# Patient Record
Sex: Male | Born: 1970 | Race: Black or African American | Hispanic: No | Marital: Married | Smoking: Current every day smoker
Health system: Southern US, Community
[De-identification: ages and names within clinical notes are randomized; demographics above are authoritative.]

## PROBLEM LIST (undated history)

## (undated) DIAGNOSIS — J302 Other seasonal allergic rhinitis: Secondary | ICD-10-CM

## (undated) DIAGNOSIS — I10 Essential (primary) hypertension: Secondary | ICD-10-CM

## (undated) DIAGNOSIS — J45909 Unspecified asthma, uncomplicated: Secondary | ICD-10-CM

## (undated) DIAGNOSIS — N200 Calculus of kidney: Secondary | ICD-10-CM

## (undated) DIAGNOSIS — I447 Left bundle-branch block, unspecified: Secondary | ICD-10-CM

## (undated) HISTORY — DX: Essential (primary) hypertension: I10

---

## 1998-07-23 ENCOUNTER — Emergency Department (HOSPITAL_COMMUNITY): Admission: EM | Admit: 1998-07-23 | Discharge: 1998-07-23 | Payer: Self-pay | Admitting: Emergency Medicine

## 1998-07-24 ENCOUNTER — Encounter: Payer: Self-pay | Admitting: Emergency Medicine

## 1999-01-09 ENCOUNTER — Ambulatory Visit (HOSPITAL_COMMUNITY): Admission: RE | Admit: 1999-01-09 | Discharge: 1999-01-09 | Payer: Self-pay | Admitting: *Deleted

## 1999-05-29 ENCOUNTER — Emergency Department (HOSPITAL_COMMUNITY): Admission: EM | Admit: 1999-05-29 | Discharge: 1999-05-29 | Payer: Self-pay | Admitting: *Deleted

## 1999-06-13 ENCOUNTER — Emergency Department (HOSPITAL_COMMUNITY): Admission: EM | Admit: 1999-06-13 | Discharge: 1999-06-13 | Payer: Self-pay | Admitting: Emergency Medicine

## 2000-05-08 ENCOUNTER — Encounter: Payer: Self-pay | Admitting: Emergency Medicine

## 2000-05-08 ENCOUNTER — Emergency Department (HOSPITAL_COMMUNITY): Admission: EM | Admit: 2000-05-08 | Discharge: 2000-05-08 | Payer: Self-pay | Admitting: Emergency Medicine

## 2001-01-22 ENCOUNTER — Emergency Department (HOSPITAL_COMMUNITY): Admission: EM | Admit: 2001-01-22 | Discharge: 2001-01-23 | Payer: Self-pay | Admitting: Emergency Medicine

## 2001-01-23 ENCOUNTER — Encounter: Payer: Self-pay | Admitting: Emergency Medicine

## 2002-04-04 ENCOUNTER — Encounter: Payer: Self-pay | Admitting: Emergency Medicine

## 2002-04-04 ENCOUNTER — Emergency Department (HOSPITAL_COMMUNITY): Admission: EM | Admit: 2002-04-04 | Discharge: 2002-04-04 | Payer: Self-pay | Admitting: Emergency Medicine

## 2002-11-28 ENCOUNTER — Emergency Department (HOSPITAL_COMMUNITY): Admission: EM | Admit: 2002-11-28 | Discharge: 2002-11-28 | Payer: Self-pay | Admitting: Neurology

## 2004-04-23 ENCOUNTER — Emergency Department (HOSPITAL_COMMUNITY): Admission: EM | Admit: 2004-04-23 | Discharge: 2004-04-23 | Payer: Self-pay | Admitting: Emergency Medicine

## 2005-01-09 ENCOUNTER — Emergency Department (HOSPITAL_COMMUNITY): Admission: EM | Admit: 2005-01-09 | Discharge: 2005-01-09 | Payer: Self-pay | Admitting: Emergency Medicine

## 2007-09-15 ENCOUNTER — Emergency Department (HOSPITAL_COMMUNITY): Admission: EM | Admit: 2007-09-15 | Discharge: 2007-09-15 | Payer: Self-pay | Admitting: Emergency Medicine

## 2007-09-30 ENCOUNTER — Emergency Department (HOSPITAL_COMMUNITY): Admission: EM | Admit: 2007-09-30 | Discharge: 2007-09-30 | Payer: Self-pay | Admitting: Emergency Medicine

## 2008-03-24 ENCOUNTER — Emergency Department (HOSPITAL_BASED_OUTPATIENT_CLINIC_OR_DEPARTMENT_OTHER): Admission: EM | Admit: 2008-03-24 | Discharge: 2008-03-24 | Payer: Self-pay | Admitting: Emergency Medicine

## 2008-06-28 ENCOUNTER — Emergency Department (HOSPITAL_BASED_OUTPATIENT_CLINIC_OR_DEPARTMENT_OTHER): Admission: EM | Admit: 2008-06-28 | Discharge: 2008-06-28 | Payer: Self-pay | Admitting: Emergency Medicine

## 2008-07-08 ENCOUNTER — Emergency Department (HOSPITAL_BASED_OUTPATIENT_CLINIC_OR_DEPARTMENT_OTHER): Admission: EM | Admit: 2008-07-08 | Discharge: 2008-07-08 | Payer: Self-pay | Admitting: Emergency Medicine

## 2008-11-13 ENCOUNTER — Ambulatory Visit: Payer: Self-pay | Admitting: Diagnostic Radiology

## 2008-11-13 ENCOUNTER — Emergency Department (HOSPITAL_BASED_OUTPATIENT_CLINIC_OR_DEPARTMENT_OTHER): Admission: EM | Admit: 2008-11-13 | Discharge: 2008-11-13 | Payer: Self-pay | Admitting: Emergency Medicine

## 2008-12-03 ENCOUNTER — Emergency Department (HOSPITAL_BASED_OUTPATIENT_CLINIC_OR_DEPARTMENT_OTHER): Admission: EM | Admit: 2008-12-03 | Discharge: 2008-12-03 | Payer: Self-pay | Admitting: Emergency Medicine

## 2008-12-10 ENCOUNTER — Emergency Department (HOSPITAL_BASED_OUTPATIENT_CLINIC_OR_DEPARTMENT_OTHER): Admission: EM | Admit: 2008-12-10 | Discharge: 2008-12-10 | Payer: Self-pay | Admitting: Emergency Medicine

## 2009-09-16 ENCOUNTER — Ambulatory Visit: Payer: Self-pay | Admitting: Interventional Radiology

## 2009-09-16 ENCOUNTER — Emergency Department (HOSPITAL_BASED_OUTPATIENT_CLINIC_OR_DEPARTMENT_OTHER): Admission: EM | Admit: 2009-09-16 | Discharge: 2009-09-16 | Payer: Self-pay | Admitting: Emergency Medicine

## 2009-11-05 ENCOUNTER — Emergency Department (HOSPITAL_BASED_OUTPATIENT_CLINIC_OR_DEPARTMENT_OTHER): Admission: EM | Admit: 2009-11-05 | Discharge: 2009-11-05 | Payer: Self-pay | Admitting: Emergency Medicine

## 2009-12-10 ENCOUNTER — Emergency Department (HOSPITAL_BASED_OUTPATIENT_CLINIC_OR_DEPARTMENT_OTHER): Admission: EM | Admit: 2009-12-10 | Discharge: 2009-12-10 | Payer: Self-pay | Admitting: Emergency Medicine

## 2009-12-10 ENCOUNTER — Ambulatory Visit: Payer: Self-pay | Admitting: Radiology

## 2010-02-07 ENCOUNTER — Ambulatory Visit: Payer: Self-pay | Admitting: Diagnostic Radiology

## 2010-02-07 ENCOUNTER — Emergency Department (HOSPITAL_BASED_OUTPATIENT_CLINIC_OR_DEPARTMENT_OTHER): Admission: EM | Admit: 2010-02-07 | Discharge: 2010-02-07 | Payer: Self-pay | Admitting: Emergency Medicine

## 2010-02-15 ENCOUNTER — Emergency Department (HOSPITAL_BASED_OUTPATIENT_CLINIC_OR_DEPARTMENT_OTHER): Admission: EM | Admit: 2010-02-15 | Discharge: 2010-02-15 | Payer: Self-pay | Admitting: Emergency Medicine

## 2010-02-15 ENCOUNTER — Ambulatory Visit: Payer: Self-pay | Admitting: Radiology

## 2010-03-01 ENCOUNTER — Encounter: Admission: RE | Admit: 2010-03-01 | Discharge: 2010-05-17 | Payer: Self-pay | Admitting: Plastic Surgery

## 2010-03-11 ENCOUNTER — Emergency Department (HOSPITAL_BASED_OUTPATIENT_CLINIC_OR_DEPARTMENT_OTHER): Admission: EM | Admit: 2010-03-11 | Discharge: 2010-03-11 | Payer: Self-pay | Admitting: Emergency Medicine

## 2010-03-22 IMAGING — CT CT HEAD W/O CM
1 series · 15 of 30 positions shown, 19 images · non-contrast
Comparison: None

CT HEAD WITHOUT CONTRAST

CLINICAL DATA: Headache, frontal and sinus pain.

CT HEAD WITHOUT CONTRAST,CT PARANASAL SINUS LIMITED WITHOUT
CONTRAST
TECHNIQUE: Contiguous axial images were obtained from the base of
the skull through the vertex without contrast. Multidetector CT
images of the paranasal sinuses were obtained in a single plane
without contrast.

[Series 2: limited sinus 3.0 h60s · axial · 0.35mm/px · z∈[+1196,+1314]mm · 15 of 43 slices shown, 19 images]
[im 2/43  brain]
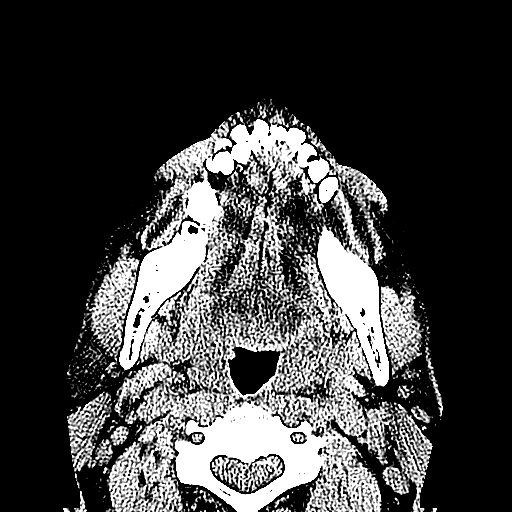
[im 2/43  bone]
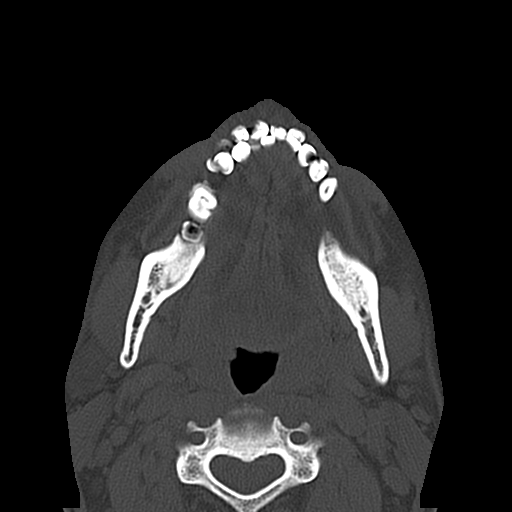
[im 5/43  brain]
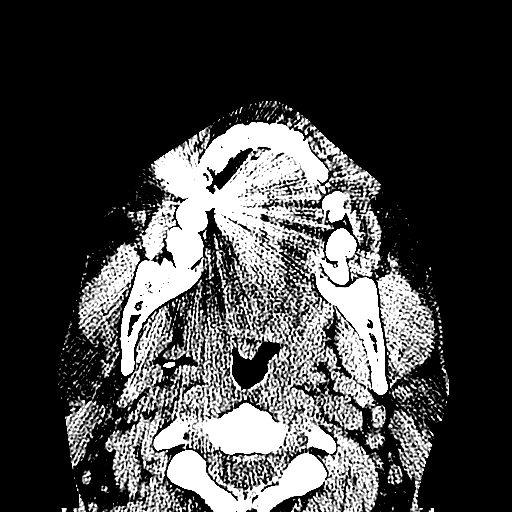
[im 8/43  brain]
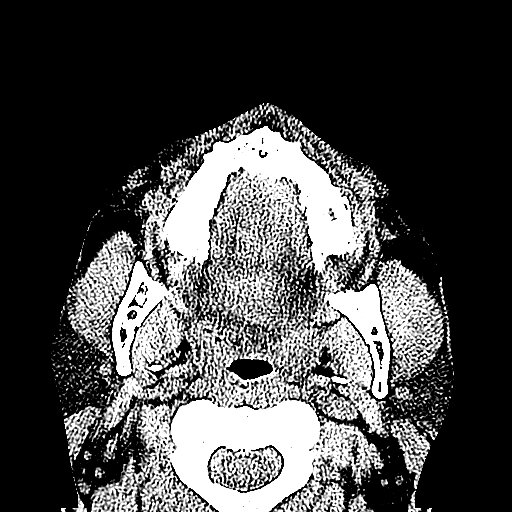
[im 11/43  brain]
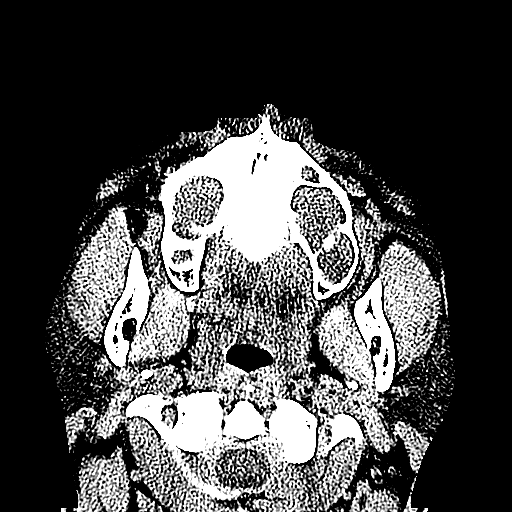
[im 14/43  brain]
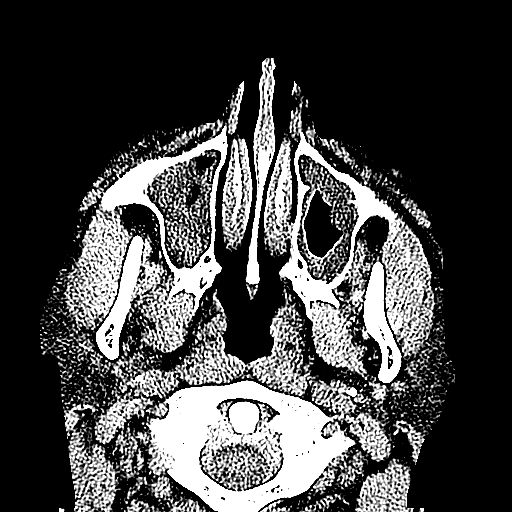
[im 14/43  bone]
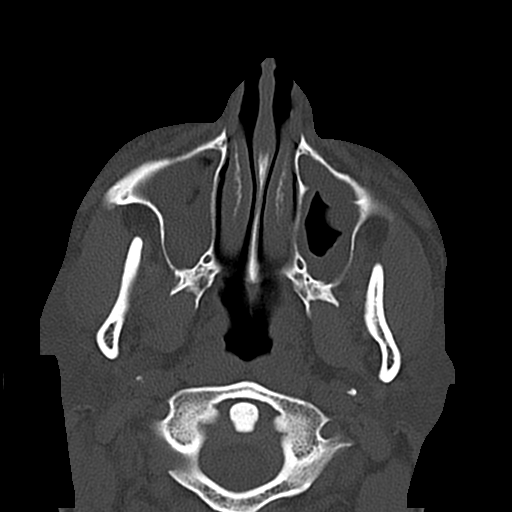
[im 16/43  brain]
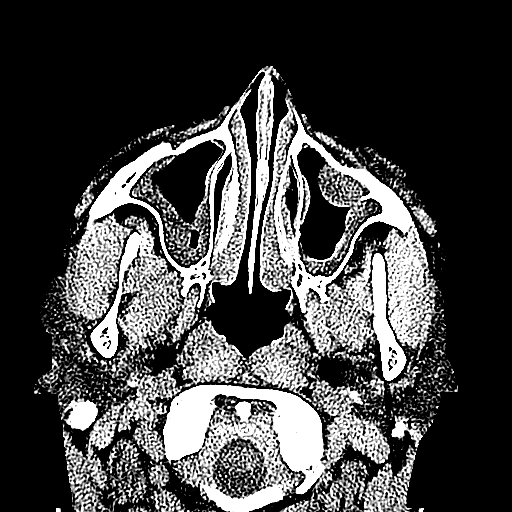
[im 19/43  brain]
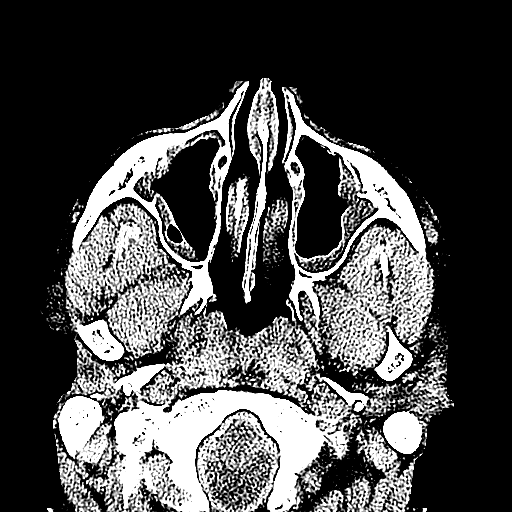
[im 22/43  brain]
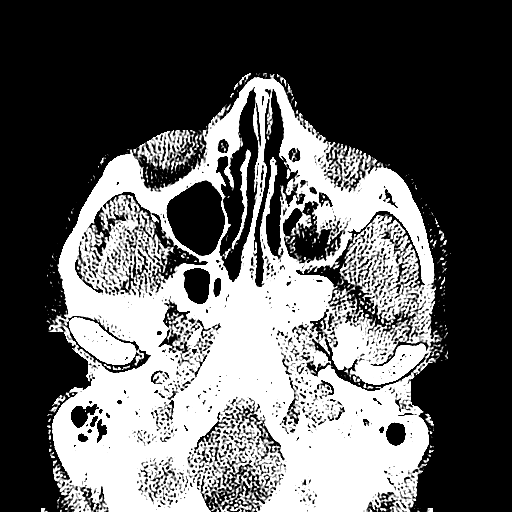
[im 24/43  brain]
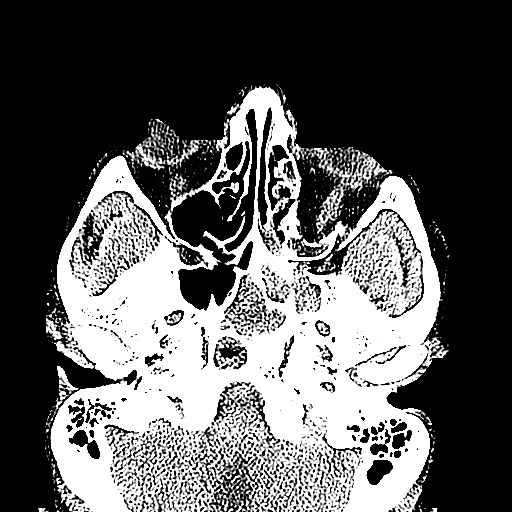
[im 24/43  bone]
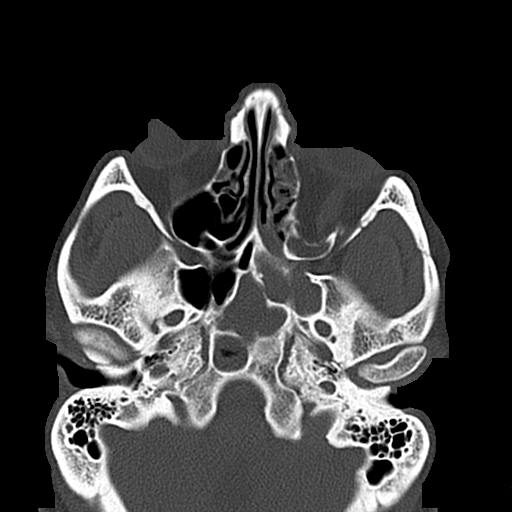
[im 27/43  brain]
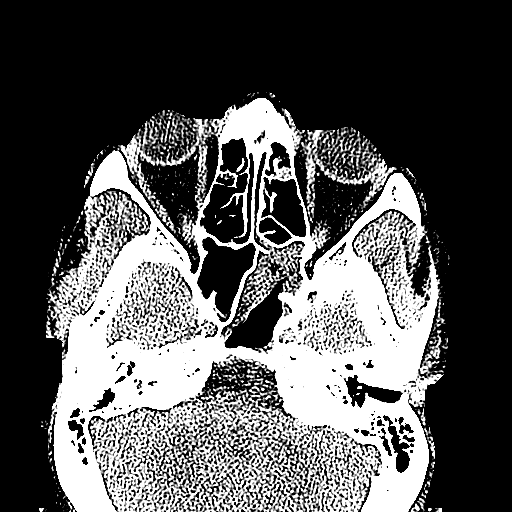
[im 29/43  brain]
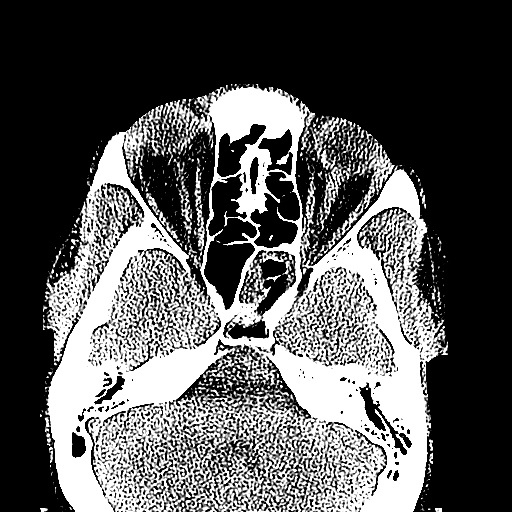
[im 32/43  brain]
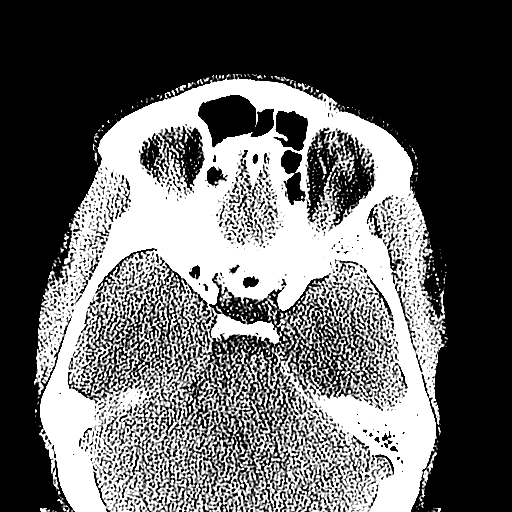
[im 35/43  brain]
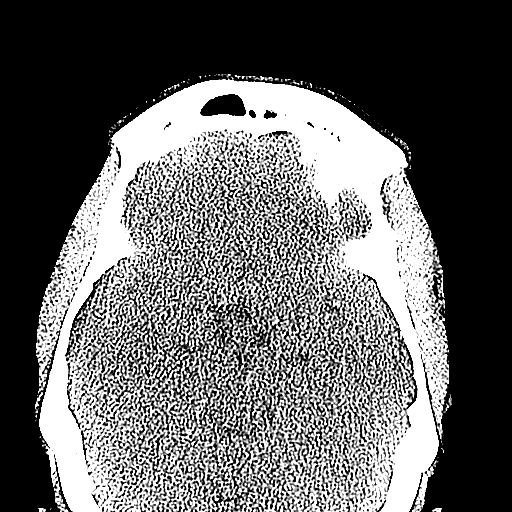
[im 35/43  bone]
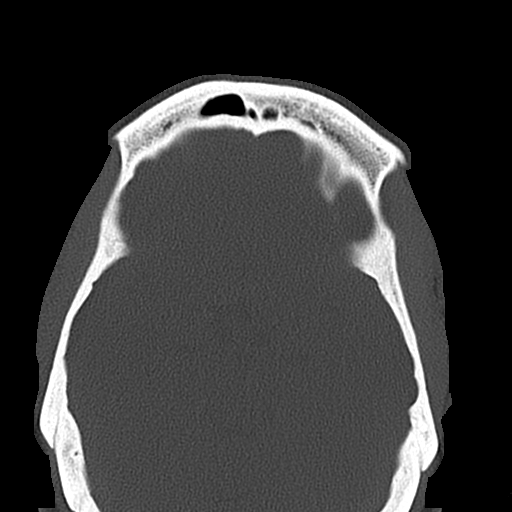
[im 38/43  brain]
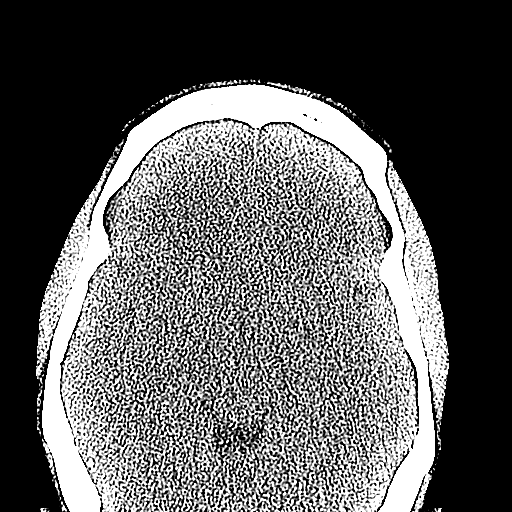
[im 41/43  brain]
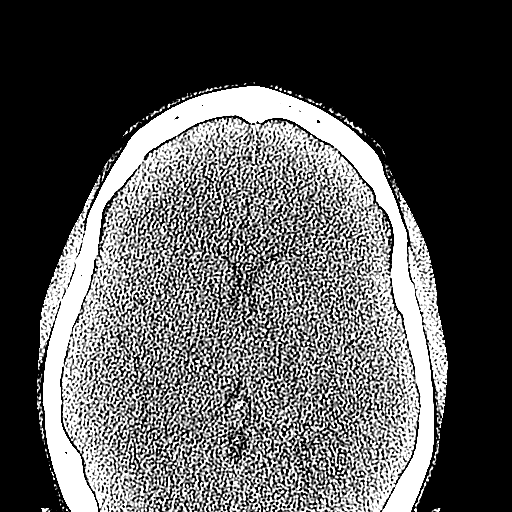

[15 of 30 positions shown; findings below may reference images not displayed]

FINDINGS: No acute intracranial abnormalities are identified,
including mass lesion or mass effect, hydrocephalus, extra-axial
fluid collection, midline shift, hemorrhage, or acute infarction.
Please note that acute infarction may be occult on CT for 24-48
hours.

Mucosal thickening within the left maxillary and left sphenoid
sinuses are noted.
Mild irregularity of the floor of the left orbit is suggestive of
prior fracture.
IMPRESSION: Left maxillary and left sphenoid sinusitis - appears chronic.

No evidence of acute intracranial abnormality.

CT PARANASAL SINUS LIMITED WITHOUT CONTRAST
FINDINGS: Moderate mucosal thickening is identified within both
maxillary sinuses, the left sphenoid sinus, and scattered left
ethmoid air cells.
No definite air-fluid levels are noted.
There is no evidence of bony destruction or mass.
The globes are within normal limits.
Irregularity of the left orbital floor suggest prior fracture.
IMPRESSION: Bilateral maxillary, left sphenoid, and left ethmoid sinusitis -
appears chronic.

Probable remote fracture of the left orbital floor.

## 2010-08-21 ENCOUNTER — Emergency Department (HOSPITAL_BASED_OUTPATIENT_CLINIC_OR_DEPARTMENT_OTHER)
Admission: EM | Admit: 2010-08-21 | Discharge: 2010-08-21 | Payer: Self-pay | Source: Home / Self Care | Admitting: Emergency Medicine

## 2010-08-21 LAB — URINALYSIS, ROUTINE W REFLEX MICROSCOPIC
Bilirubin Urine: NEGATIVE
Hemoglobin, Urine: NEGATIVE
Ketones, ur: NEGATIVE mg/dL
Nitrite: NEGATIVE
Protein, ur: NEGATIVE mg/dL
Specific Gravity, Urine: 1.008 (ref 1.005–1.030)
Urine Glucose, Fasting: NEGATIVE mg/dL
Urobilinogen, UA: 0.2 mg/dL (ref 0.0–1.0)
pH: 6.5 (ref 5.0–8.0)

## 2010-11-03 LAB — DIFFERENTIAL
Basophils Absolute: 0 10*3/uL (ref 0.0–0.1)
Basophils Relative: 1 % (ref 0–1)
Eosinophils Absolute: 0.1 10*3/uL (ref 0.0–0.7)
Eosinophils Relative: 1 % (ref 0–5)
Lymphocytes Relative: 34 % (ref 12–46)
Lymphs Abs: 2.2 10*3/uL (ref 0.7–4.0)
Monocytes Absolute: 0.5 10*3/uL (ref 0.1–1.0)
Monocytes Relative: 7 % (ref 3–12)
Neutro Abs: 3.8 10*3/uL (ref 1.7–7.7)
Neutrophils Relative %: 58 % (ref 43–77)

## 2010-11-03 LAB — BASIC METABOLIC PANEL
BUN: 13 mg/dL (ref 6–23)
CO2: 25 mEq/L (ref 19–32)
Calcium: 9 mg/dL (ref 8.4–10.5)
Chloride: 107 mEq/L (ref 96–112)
Creatinine, Ser: 0.9 mg/dL (ref 0.4–1.5)
GFR calc Af Amer: >60 mL/min (ref >60)
GFR calc non Af Amer: >60 mL/min (ref >60)
Glucose, Bld: 87 mg/dL (ref 70–99)
Potassium: 4.1 mEq/L (ref 3.5–5.1)
Sodium: 141 mEq/L (ref 135–145)

## 2010-11-03 LAB — CBC
HCT: 39.7 % (ref 39.0–52.0)
Hemoglobin: 13.6 g/dL (ref 13.0–17.0)
MCH: 31.4 pg (ref 26.0–34.0)
MCHC: 34.3 g/dL (ref 30.0–36.0)
MCV: 91.5 fL (ref 78.0–100.0)
Platelets: 201 10*3/uL (ref 150–400)
RBC: 4.34 MIL/uL (ref 4.22–5.81)
RDW: 12.3 % (ref 11.5–15.5)
WBC: 6.6 10*3/uL (ref 4.0–10.5)

## 2010-11-05 LAB — URINALYSIS, ROUTINE W REFLEX MICROSCOPIC
Bilirubin Urine: NEGATIVE
Glucose, UA: 100 mg/dL — AB
Hgb urine dipstick: NEGATIVE
Ketones, ur: NEGATIVE mg/dL
Nitrite: NEGATIVE
Protein, ur: NEGATIVE mg/dL
Specific Gravity, Urine: 1.019 (ref 1.005–1.030)
Urobilinogen, UA: 1 mg/dL (ref 0.0–1.0)
pH: 6 (ref 5.0–8.0)

## 2010-11-05 LAB — DIFFERENTIAL
Basophils Absolute: 0.1 10*3/uL (ref 0.0–0.1)
Basophils Relative: 1 % (ref 0–1)
Eosinophils Absolute: 0 10*3/uL (ref 0.0–0.7)
Eosinophils Relative: 1 % (ref 0–5)
Lymphocytes Relative: 25 % (ref 12–46)
Lymphs Abs: 1.9 10*3/uL (ref 0.7–4.0)
Monocytes Absolute: 0.4 10*3/uL (ref 0.1–1.0)
Monocytes Relative: 6 % (ref 3–12)
Neutro Abs: 4.9 10*3/uL (ref 1.7–7.7)
Neutrophils Relative %: 67 % (ref 43–77)

## 2010-11-05 LAB — POCT TOXICOLOGY PANEL

## 2010-11-05 LAB — BASIC METABOLIC PANEL
BUN: 10 mg/dL (ref 6–23)
CO2: 23 mEq/L (ref 19–32)
Calcium: 9.4 mg/dL (ref 8.4–10.5)
Chloride: 107 mEq/L (ref 96–112)
Creatinine, Ser: 0.9 mg/dL (ref 0.4–1.5)
GFR calc Af Amer: >60 mL/min (ref >60)
GFR calc non Af Amer: >60 mL/min (ref >60)
Glucose, Bld: 93 mg/dL (ref 70–99)
Potassium: 3.9 mEq/L (ref 3.5–5.1)
Sodium: 144 mEq/L (ref 135–145)

## 2010-11-05 LAB — CBC
HCT: 43.3 % (ref 39.0–52.0)
Hemoglobin: 14.8 g/dL (ref 13.0–17.0)
MCHC: 34.3 g/dL (ref 30.0–36.0)
MCV: 90.9 fL (ref 78.0–100.0)
Platelets: 168 10*3/uL (ref 150–400)
RBC: 4.77 MIL/uL (ref 4.22–5.81)
RDW: 12.2 % (ref 11.5–15.5)
WBC: 7.3 10*3/uL (ref 4.0–10.5)

## 2010-11-05 LAB — LIPASE, BLOOD: Lipase: 27 U/L (ref 23–300)

## 2011-05-08 LAB — DIFFERENTIAL
Basophils Absolute: 0
Basophils Relative: 0
Eosinophils Relative: 0
Lymphocytes Relative: 26
Monocytes Absolute: 0.7
Monocytes Relative: 6

## 2011-05-08 LAB — BASIC METABOLIC PANEL
CO2: 25
Chloride: 101
Glucose, Bld: 132 — ABNORMAL HIGH
Potassium: 3.5
Sodium: 136

## 2011-05-08 LAB — CBC
HCT: 41.2
Hemoglobin: 14.6
MCHC: 35.4
RDW: 12.8

## 2011-05-08 LAB — RAPID URINE DRUG SCREEN, HOSP PERFORMED
Benzodiazepines: NOT DETECTED
Tetrahydrocannabinol: NOT DETECTED

## 2011-06-24 IMAGING — CR DG FINGER LITTLE 2+V*L*
3 series · 3 of 3 positions shown · non-contrast
Comparison: None.

CLINICAL DATA: Jammed finger playing football.  Pain and swelling.

LEFT LITTLE FINGER 2+V

[x finger pa left]
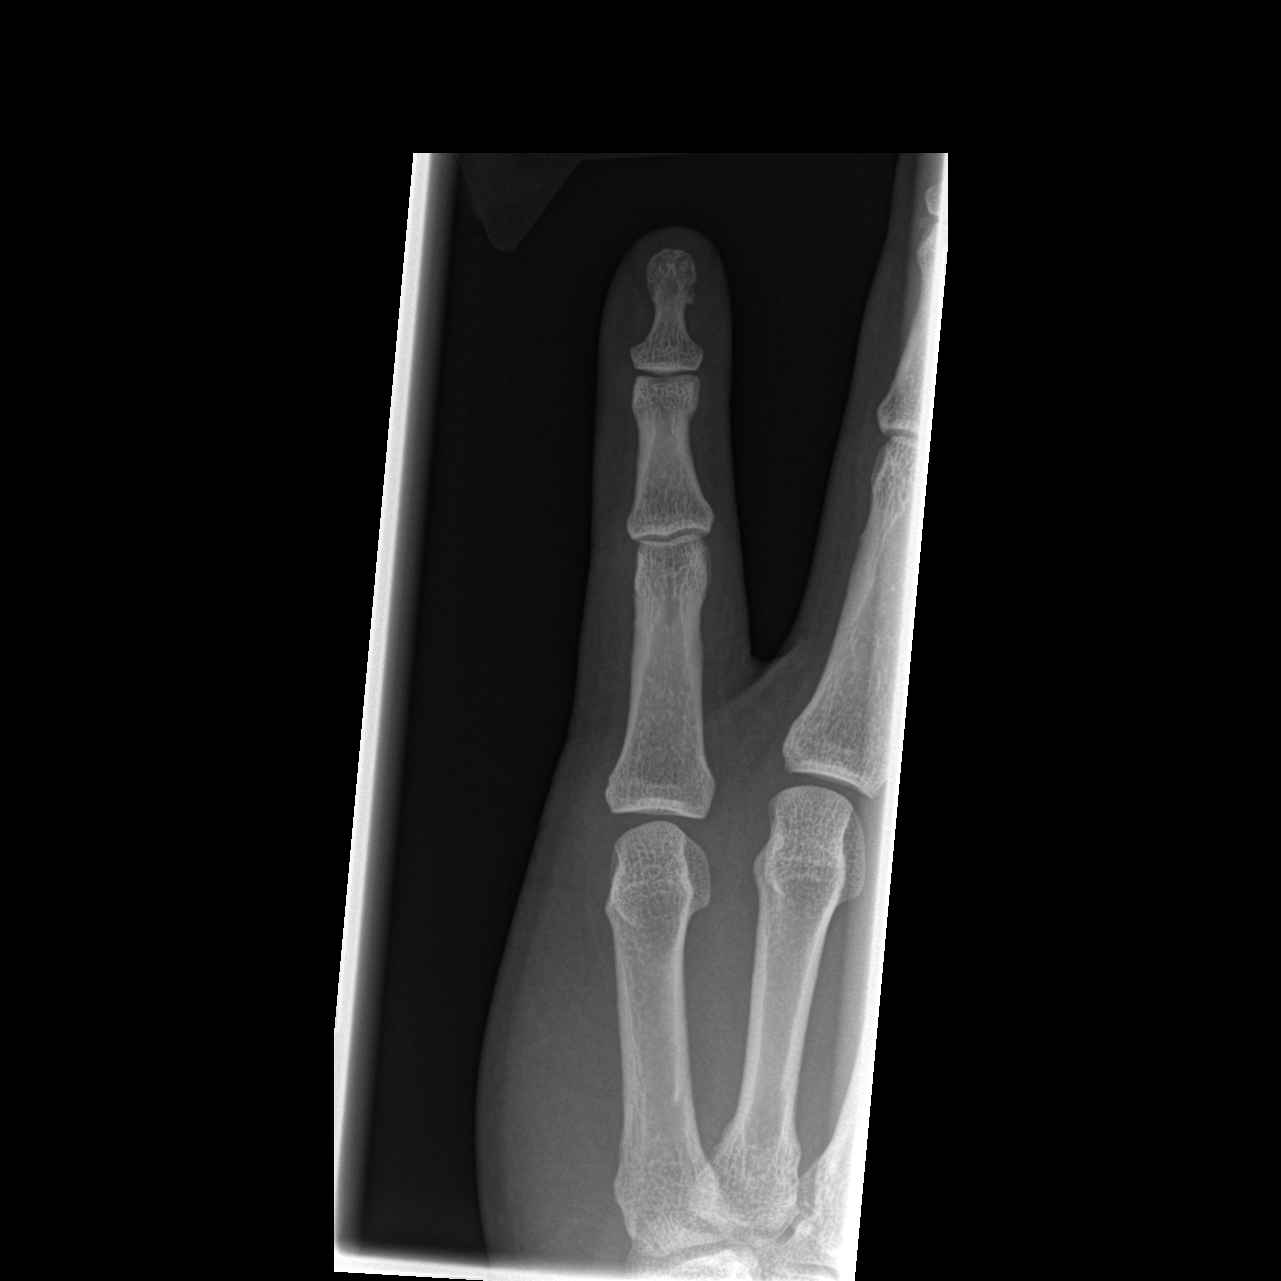

[x finger obl. left]
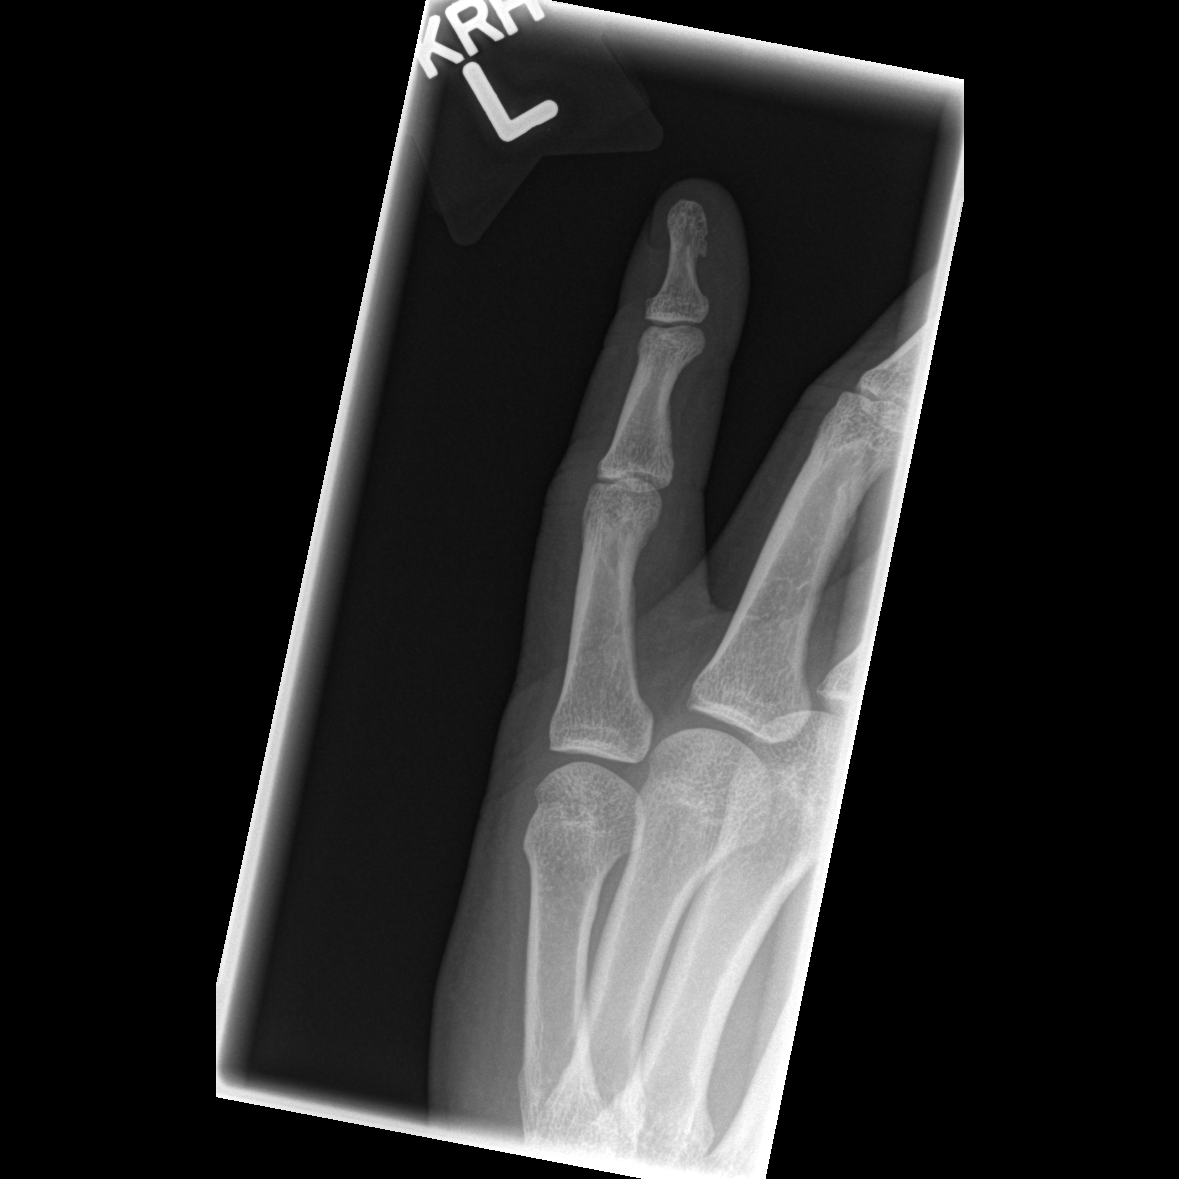

[x finger lateral left]
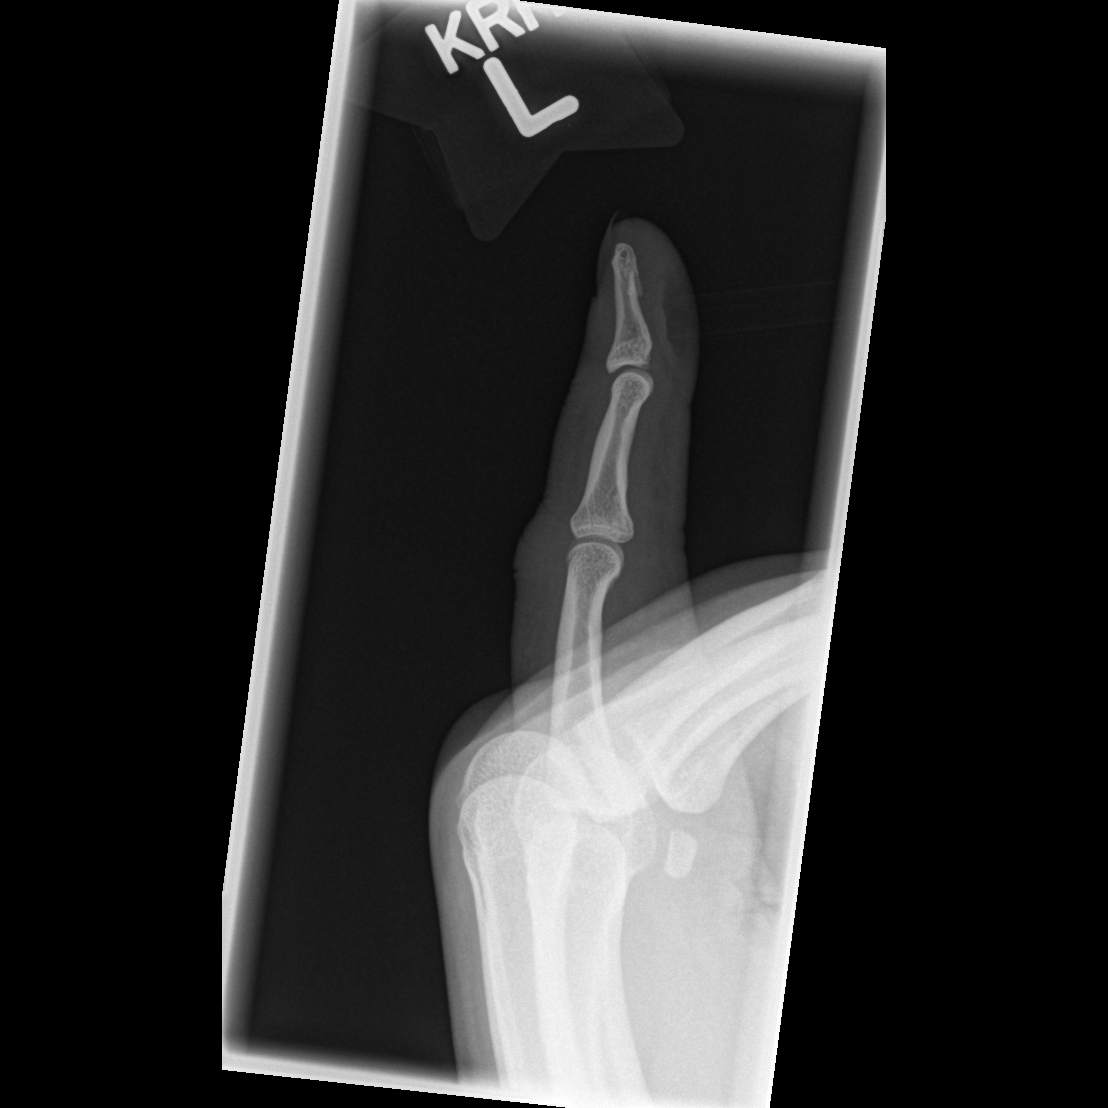

[3 of 3 positions shown; findings below may reference images not displayed]

FINDINGS: No fracture or dislocation.  No foreign body or other
abnormality of the soft tissues.
IMPRESSION: No acute or significant findings.

## 2011-10-18 ENCOUNTER — Encounter (HOSPITAL_BASED_OUTPATIENT_CLINIC_OR_DEPARTMENT_OTHER): Payer: Self-pay | Admitting: *Deleted

## 2011-10-18 ENCOUNTER — Emergency Department (INDEPENDENT_AMBULATORY_CARE_PROVIDER_SITE_OTHER): Payer: BC Managed Care – PPO

## 2011-10-18 ENCOUNTER — Emergency Department (HOSPITAL_BASED_OUTPATIENT_CLINIC_OR_DEPARTMENT_OTHER)
Admission: EM | Admit: 2011-10-18 | Discharge: 2011-10-18 | Disposition: A | Discharge status: Home / Self Care | Payer: Self-pay | Attending: Emergency Medicine | Admitting: Emergency Medicine

## 2011-10-18 DIAGNOSIS — X58XXXA Exposure to other specified factors, initial encounter: Secondary | ICD-10-CM | POA: Insufficient documentation

## 2011-10-18 DIAGNOSIS — M25539 Pain in unspecified wrist: Secondary | ICD-10-CM | POA: Insufficient documentation

## 2011-10-18 DIAGNOSIS — R209 Unspecified disturbances of skin sensation: Secondary | ICD-10-CM | POA: Insufficient documentation

## 2011-10-18 DIAGNOSIS — S5400XA Injury of ulnar nerve at forearm level, unspecified arm, initial encounter: Secondary | ICD-10-CM | POA: Insufficient documentation

## 2011-10-18 MED ORDER — HYDROCODONE-ACETAMINOPHEN 5-325 MG PO TABS: 2.0000 | ORAL_TABLET | Freq: Four times a day (QID) | ORAL | Status: AC | PRN

## 2011-10-18 MED ORDER — INDOMETHACIN 25 MG PO CAPS
50.0000 mg | ORAL_CAPSULE | Freq: Once | ORAL | Status: AC
  Administered 2011-10-18: 50 mg via ORAL
  Filled 2011-10-18: qty 2

## 2011-10-18 MED ORDER — HYDROCODONE-ACETAMINOPHEN 5-325 MG PO TABS
2.0000 | ORAL_TABLET | Freq: Once | ORAL | Status: AC
  Administered 2011-10-18: 2 via ORAL
  Filled 2011-10-18: qty 2

## 2011-10-18 MED ORDER — INDOMETHACIN 50 MG PO CAPS: 50.0000 mg | ORAL_CAPSULE | Freq: Three times a day (TID) | ORAL | Status: DC

## 2011-10-18 NOTE — ED Notes (Signed)
MD at bedside. 

## 2011-10-18 NOTE — ED Provider Notes (Signed)
History     CSN: [id number]  Arrival date & time 10/18/11  4098   First MD Initiated Contact with Patient 10/18/11 [phone]      Chief Complaint  Patient presents with  . Wrist Pain    left    (Consider location/radiation/quality/duration/timing/severity/associated sxs/prior treatment) HPI Patient is a 41 yo M who presents complaining of left wrist pain.  The patient is right hand dominant and works as an Personnel officer.  The only possible trauma he can remember was when his drill cut the volar and ulnar aspect of his left wrist on Thursday.  This drew blood but was not particularly painful.  The patient denies any associated skin changes and his pain is 9/10.  It is worse with wrist adduction and supination of the forearm.  He endorses decreased sensation over the palm in the ulnar nerve distribution. Patient noted his pain when he woke up on Friday.    History reviewed. No pertinent past medical history.  History reviewed. No pertinent past surgical history.  History reviewed. No pertinent family history.  History  Substance Use Topics  . Smoking status: Current Everyday Smoker -- 0.5 packs/day  . Smokeless tobacco: Not on file  . Alcohol Use: Yes      Review of Systems  Constitutional: Negative.   HENT: Negative.   Eyes: Negative.   Respiratory: Negative.   Cardiovascular: Negative.   Gastrointestinal: Negative.   Genitourinary: Negative.   Musculoskeletal:       Left wrist pain  Skin: Negative.   Neurological: Negative.   Hematological: Negative.   Psychiatric/Behavioral: Negative.   All other systems reviewed and are negative.    Allergies  Review of patient's allergies indicates no known allergies.  Home Medications   Current Outpatient Rx  Name Route Sig Dispense Refill  . HYDROCODONE-ACETAMINOPHEN 5-325 MG PO TABS Oral Take 2 tablets by mouth every 6 (six) hours as needed for pain. 30 tablet 0  . INDOMETHACIN 50 MG PO CAPS Oral Take 1 capsule (50 mg total)  by mouth 3 (three) times daily with meals. 30 capsule 0    BP 150/98  Pulse 82  Temp(Src) 97.3 F (36.3 C) (Oral)  Resp 16  Ht 6\' 3"  (1.905 m)  Wt 260 lb (117.935 kg)  BMI 32.50 kg/m2  SpO2 99%  Physical Exam  Nursing note and vitals reviewed. GEN: Well-developed, well-nourished male in no distress HEENT: Atraumatic, normocephalic.  EYES: PERRLA BL, no scleral icterus. NECK: Trachea midline, no meningismus Neuro: cranial nerves 2-12 intact, no abnormalities of strength or sensation, A and O x 3 MSK: Patient moves all 4 extremities symmetrically, pain on left wrist adduction and left forearm supination, decreased sensation over the volar left 4th and fifth digits as well as the hypothenar eminence in the ulnar nerve distribution, forearm compartments are soft  Skin: small scratch noted over the volar aspect of the left wrist with no surrounding erythema or edema Psych: no abnormality of mood   ED Course  Procedures (including critical care time)  Labs Reviewed - No data to display Dg Wrist Complete Left  10/18/2011  *RADIOLOGY REPORT*  Clinical Data: Pain without trauma  LEFT WRIST - COMPLETE 3+ VIEW  Comparison: None.  Findings: Carpal rows intact. Negative for fracture, dislocation, or other acute abnormality.  Normal alignment and mineralization. No significant degenerative change.  Regional soft tissues unremarkable.  IMPRESSION:  Negative  Original Report Authenticated By: Osa Craver, M.D.     1. Ulnar nerve injury  MDM  Patient was evaluated and had no signs of bony injury or compartment syndrome to explain his symptoms.  Plain film was performed for evidence of possible gas from infection or a possible small metallic foreign body from drill injury.  This was unremarkable.  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 Patient was placed in an ulnar gutter splint and given indomethacin and vicodin.  He is.told to avoid the painful  movements and take anti-inflammatories.  He is referred to sports medicine for further management if symptoms persist despite conservative efforts.  Patient was NVI after splinting and was discharged in good condition.        Cyndra Numbers, MD 10/18/11 [phone]

## 2011-10-18 NOTE — ED Notes (Signed)
Right wrist and hand swelling

## 2011-10-22 ENCOUNTER — Ambulatory Visit (INDEPENDENT_AMBULATORY_CARE_PROVIDER_SITE_OTHER): Payer: Self-pay | Admitting: Family Medicine

## 2011-10-22 ENCOUNTER — Encounter: Payer: Self-pay | Admitting: Family Medicine

## 2011-10-22 VITALS — BP 157/94 | HR 83 | Temp 98.6°F | Ht 75.0 in | Wt 240.0 lb

## 2011-10-22 DIAGNOSIS — G5620 Lesion of ulnar nerve, unspecified upper limb: Secondary | ICD-10-CM

## 2011-10-22 DIAGNOSIS — M25539 Pain in unspecified wrist: Secondary | ICD-10-CM

## 2011-10-22 DIAGNOSIS — M25532 Pain in left wrist: Secondary | ICD-10-CM

## 2011-10-22 MED ORDER — PREDNISONE (PAK) 10 MG PO TABS: ORAL_TABLET | ORAL | Status: DC

## 2011-10-22 MED ORDER — MELOXICAM 15 MG PO TABS: 15.0000 mg | ORAL_TABLET | Freq: Every day | ORAL | Status: AC

## 2011-10-22 NOTE — Patient Instructions (Signed)
Your history and symptoms are consistent with pinching of the ulnar nerve as it comes through Guyon's canal at the wrist. Start prednisone as directed then after finishing take meloxicam 15mg  daily with food. Vicodin as needed for severe pain (we don't refill this but can step you down to a milder narcotic if you run out) - no driving on this. Do not ice this - they can make nerve issues worse. Wrist brace as much as possible including at night to protect the area and prevent compression. Follow up with me in 2 weeks. If not improved we would move forward with nerve conduction studies.

## 2011-10-23 ENCOUNTER — Encounter: Payer: Self-pay | Admitting: Family Medicine

## 2011-10-23 DIAGNOSIS — M25532 Pain in left wrist: Secondary | ICD-10-CM | POA: Insufficient documentation

## 2011-10-23 NOTE — Progress Notes (Signed)
Subjective:    Patient ID: Kyle Wall, male    DOB: 09/24/1970, 41 y.o.   MRN: [id number]  PCP: None  HPI 41 yo F here for left hand pain.  Patient denies known injury or trauma to left hand. He had a drill bit burn him dorsal left wrist but states it did not puncture the skin. States he woke up about 1 1/2 weeks ago with worsening pain, numbness/tingling in ulnar side of wrist, hand, and into 4th and 5th fingers. Went to ED - told he likely had an ulnar nerve injury/neuropathy and referred here. Was placed in an ulnar gutter splint. States symptoms have improved over past few days. A little swelling in this area. He is right handed. Pain, numbness worse with activity. No prior wrist, elbow injuries.  No elbow pain.  Past Medical History  Diagnosis Date  . Hypertension     Current Outpatient Prescriptions on File Prior to Visit  Medication Sig Dispense Refill  . HYDROcodone-acetaminophen (NORCO) 5-325 MG per tablet Take 2 tablets by mouth every 6 (six) hours as needed for pain.  30 tablet  0    History reviewed. No pertinent past surgical history.  No Known Allergies  History   Social History  . Marital Status: Married    Spouse Name: N/A    Number of Children: N/A  . Years of Education: N/A   Occupational History  . Not on file.   Social History Main Topics  . Smoking status: Current Everyday Smoker -- 0.5 packs/day  . Smokeless tobacco: Not on file  . Alcohol Use: Yes  . Drug Use: No  . Sexually Active: Not on file   Other Topics Concern  . Not on file   Social History Narrative  . No narrative on file    Family History  Problem Relation Age of Onset  . Hypertension Mother   . Hypertension Father   . Diabetes Neg Hx   . Heart attack Neg Hx   . Hyperlipidemia Neg Hx   . Sudden death Neg Hx     BP 157/94  Pulse 83  Temp(Src) 98.6 F (37 C) (Oral)  Ht 6\' 3"  (1.905 m)  Wt 240 lb (108.863 kg)  BMI 30.00 kg/m2  Review of Systems See HPI  above.    Objective:   Physical Exam Gen: NAD  L wrist: Mild swelling ulnar side but symmatric bilaterally.  No bruising.  Very superficial ?burn this side of wrist also but not directly over guyons canal.  No other deformity. TTP guyon's canal, less so ulnar side of hand and 4th/5th digits. Mod limitation of flexion and extension due to pain.  FROM digits. Strength 5/5 with finger abduction, extension.  5-/5 with thumb opposition. Sensation diminished to light touch ulnar hand and 4th/5th digits. Cap refill < 2 secs. Mild + tinels guyon canal - negative at cubital tunnel and carpal tunnel.  R wrist: FROM without pain, swelling.    Assessment & Plan:  1.  Left wrist pain - History and exam consistent with guyon canal syndrome.  No trauma and well-preserved strength (aside from very mild opposition weakness) - do not believe burn from drill bit caused nerve injury/transection.  Should resolve with time and rest.  Wrist brace provided.  Start prednisone with transition to mobic.  Hydrocodone as needed for severe pain - refill provided today #60 tablets to take every 6 hours as needed.  See instructions for further.  F/u in 2 weeks.  If  not improving would consider nerve conduction velocities/EMG though these can be falsely negative within 6 weeks of presentation.

## 2011-10-23 NOTE — Assessment & Plan Note (Signed)
History and exam consistent with guyon canal syndrome.  No trauma and well-preserved strength (aside from very mild opposition weakness) - do not believe burn from drill bit caused nerve injury/transection.  Should resolve with time and rest.  Wrist brace provided.  Start prednisone with transition to mobic.  Hydrocodone as needed for severe pain - refill provided today #60 tablets to take every 6 hours as needed.  See instructions for further.  F/u in 2 weeks.  If not improving would consider nerve conduction velocities/EMG though these can be falsely negative within 6 weeks of presentation.

## 2011-11-04 ENCOUNTER — Emergency Department (HOSPITAL_BASED_OUTPATIENT_CLINIC_OR_DEPARTMENT_OTHER)
Admission: EM | Admit: 2011-11-04 | Discharge: 2011-11-05 | Disposition: A | Discharge status: Home / Self Care | Payer: Self-pay | Attending: Emergency Medicine | Admitting: Emergency Medicine

## 2011-11-04 ENCOUNTER — Encounter (HOSPITAL_BASED_OUTPATIENT_CLINIC_OR_DEPARTMENT_OTHER): Payer: Self-pay | Admitting: *Deleted

## 2011-11-04 DIAGNOSIS — R109 Unspecified abdominal pain: Secondary | ICD-10-CM | POA: Insufficient documentation

## 2011-11-04 DIAGNOSIS — I10 Essential (primary) hypertension: Secondary | ICD-10-CM | POA: Insufficient documentation

## 2011-11-04 DIAGNOSIS — R1030 Lower abdominal pain, unspecified: Secondary | ICD-10-CM

## 2011-11-04 HISTORY — DX: Calculus of kidney: N20.0

## 2011-11-04 LAB — URINALYSIS, ROUTINE W REFLEX MICROSCOPIC
Glucose, UA: NEGATIVE mg/dL
Leukocytes, UA: NEGATIVE
Nitrite: NEGATIVE
Protein, ur: NEGATIVE mg/dL

## 2011-11-04 NOTE — ED Notes (Signed)
Pt c/o pain in lower left abdomen that radiates around to lower back area. Pain comes and goes. Feels sharp and shoots up and around

## 2011-11-05 ENCOUNTER — Emergency Department (INDEPENDENT_AMBULATORY_CARE_PROVIDER_SITE_OTHER): Payer: Self-pay

## 2011-11-05 DIAGNOSIS — R109 Unspecified abdominal pain: Secondary | ICD-10-CM

## 2011-11-05 MED ORDER — HYDROCODONE-ACETAMINOPHEN 5-325 MG PO TABS: 1.0000 | ORAL_TABLET | Freq: Four times a day (QID) | ORAL | Status: AC | PRN

## 2011-11-05 NOTE — ED Provider Notes (Signed)
History     CSN: [id number]  Arrival date & time 11/04/11  2137   First MD Initiated Contact with Patient 11/05/11 0059      Chief Complaint  Patient presents with  . Flank Pain    (Consider location/radiation/quality/duration/timing/severity/associated sxs/prior treatment) HPI This is a 41 year old male with about a four-month history of intermittent pain in his left groin that radiates back to his left flank. It is at times mild and at times severe. It is a sharp sensation and seems to originate in the groin not the flank. It is exacerbated by certain positions and relieved by certain positions, particularly when sleeping. It has not been associated with gross hematuria. He states he has had hematuria in the past and was told that he might have had a kidney stone but has not had a formal diagnosis. He denies fever, chills, nausea, vomiting or masses in the groin.  Past Medical History  Diagnosis Date  . Hypertension   . Kidney stones     History reviewed. No pertinent past surgical history.  Family History  Problem Relation Age of Onset  . Hypertension Mother   . Hypertension Father   . Diabetes Neg Hx   . Heart attack Neg Hx   . Hyperlipidemia Neg Hx   . Sudden death Neg Hx     History  Substance Use Topics  . Smoking status: Current Everyday Smoker -- 0.5 packs/day  . Smokeless tobacco: Not on file  . Alcohol Use: Yes      Review of Systems  All other systems reviewed and are negative.    Allergies  Review of patient's allergies indicates no known allergies.  Home Medications   Current Outpatient Rx  Name Route Sig Dispense Refill  . MELOXICAM 15 MG PO TABS Oral Take 1 tablet (15 mg total) by mouth daily. With food.  Start after finishing prednisone. 30 tablet 1  . PREDNISONE (PAK) 10 MG PO TABS  6 tabs po day 1, 5 tabs po day 2, 4 tabs po day 3, 3 tabs po day 4, 2 tabs po day 5, 1 tab po day 6 21 tablet 0    BP 139/75  Pulse 96  Temp(Src) 97.9 F  (36.6 C) (Oral)  Resp 18  SpO2 100%  Physical Exam General: Well-developed, well-nourished male in no acute distress; appearance consistent with age of record HENT: normocephalic, atraumatic Eyes: pupils equal round and reactive to light; extraocular muscles intact Neck: supple Heart: regular rate and rhythm Lungs: clear to auscultation bilaterally Abdomen: soft; nondistended; nontender; no masses or hepatosplenomegaly; bowel sounds present GU: No inguinal hernia palpated; mild left groin tenderness; no testicular tenderness; mild left flank tenderness Extremities: No deformity; full range of motion Neurologic: Awake, alert and oriented; motor function intact in all extremities and symmetric; no facial droop Skin: Warm and dry Psychiatric: Normal mood and affect    ED Course  Procedures (including critical care time)     MDM   Nursing notes and vitals signs, including pulse oximetry, reviewed.  Summary of this visit's results, reviewed by myself:  Labs:  Results for orders placed during the hospital encounter of 11/04/11  URINALYSIS, ROUTINE W REFLEX MICROSCOPIC      Component Value Range   Color, Urine YELLOW  YELLOW    APPearance CLEAR  CLEAR    Specific Gravity, Urine 1.004 (*) 1.005 - 1.030    pH 6.0  5.0 - 8.0    Glucose, UA NEGATIVE  NEGATIVE (mg/dL)  Hgb urine dipstick NEGATIVE  NEGATIVE    Bilirubin Urine NEGATIVE  NEGATIVE    Ketones, ur NEGATIVE  NEGATIVE (mg/dL)   Protein, ur NEGATIVE  NEGATIVE (mg/dL)   Urobilinogen, UA 0.2  0.0 - 1.0 (mg/dL)   Nitrite NEGATIVE  NEGATIVE    Leukocytes, UA NEGATIVE  NEGATIVE    2:53 AM CT of abdomen and pelvis negative for acute findings.            Hanley Seamen, MD 11/05/11 [phone]

## 2011-11-05 NOTE — Discharge Instructions (Signed)
The CT scan of your abdomen and pelvis did not show any kidney stones or other cause for your pain. The pain may be musculoskeletal in nature.

## 2011-11-07 ENCOUNTER — Ambulatory Visit: Payer: Self-pay | Admitting: Family Medicine

## 2011-11-21 ENCOUNTER — Ambulatory Visit: Payer: Self-pay | Admitting: Family Medicine

## 2013-03-15 ENCOUNTER — Encounter (HOSPITAL_BASED_OUTPATIENT_CLINIC_OR_DEPARTMENT_OTHER): Payer: Self-pay | Admitting: *Deleted

## 2013-03-15 ENCOUNTER — Emergency Department (HOSPITAL_BASED_OUTPATIENT_CLINIC_OR_DEPARTMENT_OTHER)
Admission: EM | Admit: 2013-03-15 | Discharge: 2013-03-15 | Disposition: A | Discharge status: Home / Self Care | Payer: Self-pay | Attending: Emergency Medicine | Admitting: Emergency Medicine

## 2013-03-15 DIAGNOSIS — Z720 Tobacco use: Secondary | ICD-10-CM

## 2013-03-15 DIAGNOSIS — M545 Low back pain, unspecified: Secondary | ICD-10-CM | POA: Insufficient documentation

## 2013-03-15 DIAGNOSIS — IMO0002 Reserved for concepts with insufficient information to code with codable children: Secondary | ICD-10-CM | POA: Insufficient documentation

## 2013-03-15 DIAGNOSIS — F17200 Nicotine dependence, unspecified, uncomplicated: Secondary | ICD-10-CM | POA: Insufficient documentation

## 2013-03-15 DIAGNOSIS — I10 Essential (primary) hypertension: Secondary | ICD-10-CM | POA: Insufficient documentation

## 2013-03-15 DIAGNOSIS — Z87442 Personal history of urinary calculi: Secondary | ICD-10-CM | POA: Insufficient documentation

## 2013-03-15 MED ORDER — CYCLOBENZAPRINE HCL 10 MG PO TABS: 10.0000 mg | ORAL_TABLET | Freq: Two times a day (BID) | ORAL | Status: DC | PRN

## 2013-03-15 MED ORDER — OXYCODONE-ACETAMINOPHEN 5-325 MG PO TABS
2.0000 | ORAL_TABLET | Freq: Once | ORAL | Status: AC
  Administered 2013-03-15: 2 via ORAL
  Filled 2013-03-15 (×2): qty 2

## 2013-03-15 MED ORDER — OXYCODONE-ACETAMINOPHEN 5-325 MG PO TABS: 2.0000 | ORAL_TABLET | ORAL | Status: DC | PRN

## 2013-03-15 NOTE — ED Notes (Signed)
Picking something up last week and began having low back pain is not getting any better

## 2013-03-15 NOTE — ED Provider Notes (Signed)
CSN: [id number]     Arrival date & time 03/15/13  0747 History     First MD Initiated Contact with Patient 03/15/13 0802     Chief Complaint  Patient presents with  . Back Pain   (Consider location/radiation/quality/duration/timing/severity/associated sxs/prior Treatment) Patient is a 42 y.o. male presenting with back pain. The history is provided by the patient.  Back Pain Location:  Lumbar spine Quality:  Stabbing Radiates to:  Does not radiate Pain severity:  Severe Pain is:  Same all the time Onset quality:  Sudden Timing:  Constant Progression:  Waxing and waning Chronicity:  New Context: twisting   Context: not emotional stress, not falling, not jumping from heights, not MCA, not MVA, not occupational injury, not pedestrian accident, not physical stress, not recent illness and not recent injury   Relieved by:  Nothing Worsened by:  Bending, movement and twisting Ineffective treatments:  Heating pad, ibuprofen, muscle relaxants and lying down Associated symptoms: no abdominal pain, no abdominal swelling, no bladder incontinence, no numbness, no paresthesias, no tingling and no weakness   Risk factors: no lack of exercise     Past Medical History  Diagnosis Date  . Hypertension   . Kidney stones    History reviewed. No pertinent past surgical history. Family History  Problem Relation Age of Onset  . Hypertension Mother   . Hypertension Father   . Diabetes Neg Hx   . Heart attack Neg Hx   . Hyperlipidemia Neg Hx   . Sudden death Neg Hx    History  Substance Use Topics  . Smoking status: Current Every Day Smoker -- 0.50 packs/day  . Smokeless tobacco: Not on file  . Alcohol Use: Yes    Review of Systems  Gastrointestinal: Negative for abdominal pain.  Genitourinary: Negative for bladder incontinence.  Musculoskeletal: Positive for back pain.  Neurological: Negative for tingling, weakness, numbness and paresthesias.  All other systems reviewed and are  negative.    Allergies  Review of patient's allergies indicates no known allergies.  Home Medications   Current Outpatient Rx  Name  Route  Sig  Dispense  Refill  . predniSONE (STERAPRED UNI-PAK) 10 MG tablet      6 tabs po day 1, 5 tabs po day 2, 4 tabs po day 3, 3 tabs po day 4, 2 tabs po day 5, 1 tab po day 6   21 tablet   0    BP 148/97  Pulse 80  Temp(Src) 98.4 F (36.9 C) (Oral)  Resp 18  Ht 6\' 3"  (1.905 m)  Wt 250 lb (113.399 kg)  BMI 31.25 kg/m2  SpO2 100% Physical Exam  Nursing note and vitals reviewed. Constitutional: He is oriented to person, place, and time. He appears well-developed and well-nourished.  HENT:  Head: Normocephalic and atraumatic.  Right Ear: Tympanic membrane and external ear normal.  Left Ear: Tympanic membrane and external ear normal.  Nose: Nose normal. Right sinus exhibits no maxillary sinus tenderness and no frontal sinus tenderness. Left sinus exhibits no maxillary sinus tenderness and no frontal sinus tenderness.  Eyes: Conjunctivae and EOM are normal. Pupils are equal, round, and reactive to light. Right eye exhibits no nystagmus. Left eye exhibits no nystagmus.  Neck: Normal range of motion. Neck supple.  Cardiovascular: Normal rate, regular rhythm, normal heart sounds and intact distal pulses.   Pulmonary/Chest: Effort normal and breath sounds normal. No respiratory distress. He exhibits no tenderness.  Abdominal: Soft. Bowel sounds are normal. He exhibits no  distension and no mass. There is no tenderness.  Musculoskeletal: Normal range of motion. He exhibits no edema and no tenderness.  Neurological: He is alert and oriented to person, place, and time. He has normal strength and normal reflexes. No sensory deficit. He displays a negative Romberg sign. GCS eye subscore is 4. GCS verbal subscore is 5. GCS motor subscore is 6.  Reflex Scores:      Tricep reflexes are 2+ on the right side and 2+ on the left side.      Bicep reflexes are  2+ on the right side and 2+ on the left side.      Brachioradialis reflexes are 2+ on the right side and 2+ on the left side.      Patellar reflexes are 2+ on the right side and 2+ on the left side.      Achilles reflexes are 2+ on the right side and 2+ on the left side. Patient with normal gait without ataxia, shuffling, spasm, or antalgia. Speech is normal without dysarthria, dysphasia, or aphasia. Muscle strength is 5/5 in bilateral shoulders, elbow flexor and extensors, wrist flexor and extensors, and intrinsic hand muscles. 5/5 bilateral lower extremity hip flexors, extensors, knee flexors and extensors, and ankle dorsi and plantar flexors.    Skin: Skin is warm and dry. No rash noted.  Psychiatric: He has a normal mood and affect. His behavior is normal. Judgment and thought content normal.    ED Course   Procedures (including critical care time)  Labs Reviewed - No data to display No results found. No diagnosis found.  MDM  42 y.o. Male with low back pain and normal neuro exam.  Plan conservative treatment withpain meds and referral.  Patient advised regarding recheck of hypertension and smoking cessation.   Hilario Quarry, MD 03/15/13 [phone]

## 2014-09-24 ENCOUNTER — Emergency Department (HOSPITAL_BASED_OUTPATIENT_CLINIC_OR_DEPARTMENT_OTHER)
Admission: EM | Admit: 2014-09-24 | Discharge: 2014-09-24 | Disposition: A | Discharge status: Home / Self Care | Payer: Self-pay | Attending: Emergency Medicine | Admitting: Emergency Medicine

## 2014-09-24 ENCOUNTER — Encounter (HOSPITAL_BASED_OUTPATIENT_CLINIC_OR_DEPARTMENT_OTHER): Payer: Self-pay

## 2014-09-24 DIAGNOSIS — H538 Other visual disturbances: Secondary | ICD-10-CM | POA: Insufficient documentation

## 2014-09-24 DIAGNOSIS — Z79899 Other long term (current) drug therapy: Secondary | ICD-10-CM | POA: Insufficient documentation

## 2014-09-24 DIAGNOSIS — Z720 Tobacco use: Secondary | ICD-10-CM | POA: Insufficient documentation

## 2014-09-24 DIAGNOSIS — Z87442 Personal history of urinary calculi: Secondary | ICD-10-CM | POA: Insufficient documentation

## 2014-09-24 DIAGNOSIS — R519 Headache, unspecified: Secondary | ICD-10-CM

## 2014-09-24 DIAGNOSIS — Z7952 Long term (current) use of systemic steroids: Secondary | ICD-10-CM | POA: Insufficient documentation

## 2014-09-24 DIAGNOSIS — K002 Abnormalities of size and form of teeth: Secondary | ICD-10-CM | POA: Insufficient documentation

## 2014-09-24 DIAGNOSIS — I10 Essential (primary) hypertension: Secondary | ICD-10-CM | POA: Insufficient documentation

## 2014-09-24 DIAGNOSIS — R51 Headache: Secondary | ICD-10-CM | POA: Insufficient documentation

## 2014-09-24 HISTORY — DX: Other seasonal allergic rhinitis: J30.2

## 2014-09-24 LAB — BASIC METABOLIC PANEL
Anion gap: 5 (ref 5–15)
BUN: 11 mg/dL (ref 6–23)
CALCIUM: 8.9 mg/dL (ref 8.4–10.5)
CO2: 26 mmol/L (ref 19–32)
CREATININE: 0.91 mg/dL (ref 0.50–1.35)
Chloride: 105 mmol/L (ref 96–112)
GFR calc non Af Amer: >90 mL/min (ref >90)
Glucose, Bld: 108 mg/dL — ABNORMAL HIGH (ref 70–99)
Potassium: 3.7 mmol/L (ref 3.5–5.1)
SODIUM: 136 mmol/L (ref 135–145)

## 2014-09-24 LAB — CBC
HCT: 41.1 % (ref 39.0–52.0)
Hemoglobin: 14.3 g/dL (ref 13.0–17.0)
MCH: 31.1 pg (ref 26.0–34.0)
MCHC: 34.8 g/dL (ref 30.0–36.0)
MCV: 89.3 fL (ref 78.0–100.0)
Platelets: 180 10*3/uL (ref 150–400)
RBC: 4.6 MIL/uL (ref 4.22–5.81)
RDW: 12.5 % (ref 11.5–15.5)
WBC: 6.5 10*3/uL (ref 4.0–10.5)

## 2014-09-24 MED ORDER — ACETAMINOPHEN 500 MG PO TABS
1000.0000 mg | ORAL_TABLET | Freq: Once | ORAL | Status: AC
  Administered 2014-09-24: 1000 mg via ORAL
  Filled 2014-09-24: qty 2

## 2014-09-24 MED ORDER — HYDROCHLOROTHIAZIDE 25 MG PO TABS: 25.0000 mg | ORAL_TABLET | Freq: Every day | ORAL | Status: AC

## 2014-09-24 NOTE — ED Notes (Addendum)
C/o HA. (denies: light sensitivity, nvd, fever, dizziness, dental or ENT issues, or sob), states, "not sure if r/t my BP". Does not take prescribed BP meds. No meds PTA.  Pt noted to have nasal congestion. Denies change in congestion. Also mentions "always have blurry vision, no change". Alert, NAD, calm, interactive. No dyspnea noted. [facility]

## 2014-09-24 NOTE — ED Provider Notes (Signed)
CSN: [id number]     Arrival date & time 09/24/14  0055 History   First MD Initiated Contact with Patient 09/24/14 0116     Chief Complaint  Patient presents with  . Headache     (Consider location/radiation/quality/duration/timing/severity/associated sxs/prior Treatment) HPI 44 year old male presents with a headache that started 2 days ago. He states the headache is been constant since onset. Started in the middle of the night 2 nights ago. [facility] pressure and it was 183/90. Patient states that headache she's had similar to this in the past always been associated with high blood pressure. He used to be on blood pressure medicine but cannot room in the name. He has not taken in over a year. He was first diagnosed with blood pressure problems due to a headache in the past. Patient denies any chest pain or shortness of breath. Endorses he has intermittent blurry vision for at least one year, no blurry vision now. Has trouble reading things up close. Headache is currently a 7 out of 10. This headache is similar to when he has headaches in the past, which he gets infrequently. No weakness or numbness. No nausea/vomiting. No neck stiffness.  Past Medical History  Diagnosis Date  . Hypertension   . Kidney stones   . Seasonal allergies    History reviewed. No pertinent past surgical history. Family History  Problem Relation Age of Onset  . Hypertension Mother   . Hypertension Father   . Diabetes Neg Hx   . Heart attack Neg Hx   . Hyperlipidemia Neg Hx   . Sudden death Neg Hx    History  Substance Use Topics  . Smoking status: Current Every Day Smoker -- 1.00 packs/day  . Smokeless tobacco: Not on file  . Alcohol Use: Yes    Review of Systems  Constitutional: Negative for fever.  Eyes: Positive for visual disturbance.  Respiratory: Negative for shortness of breath.   Cardiovascular: Negative for chest pain.  Gastrointestinal: Negative for  nausea and vomiting.  Musculoskeletal: Negative for neck pain and neck stiffness.  Neurological: Positive for headaches. Negative for dizziness, weakness, light-headedness and numbness.  All other systems reviewed and are negative.     Allergies  Review of patient's allergies indicates no known allergies.  Home Medications   Prior to Admission medications   Medication Sig Start Date End Date Taking? Authorizing Provider  cyclobenzaprine (FLEXERIL) 10 MG tablet Take 1 tablet (10 mg total) by mouth 2 (two) times daily as needed for muscle spasms. 03/15/13   Hilario Quarryanielle S Ray, MD  oxyCODONE-acetaminophen (PERCOCET/ROXICET) 5-325 MG per tablet Take 2 tablets by mouth every 4 (four) hours as needed for pain. 03/15/13   Hilario Quarryanielle S Ray, MD  predniSONE (STERAPRED UNI-PAK) 10 MG tablet 6 tabs po day 1, 5 tabs po day 2, 4 tabs po day 3, 3 tabs po day 4, 2 tabs po day 5, 1 tab po day 6 10/22/11   Lenda KelpShane R Hudnall, MD   BP 146/92 mmHg  Pulse 86  Temp(Src) 98.1 F (36.7 C) (Oral)  Resp 18  Ht 6' (1.829 m)  Wt 246 lb (111.585 kg)  BMI 33.36 kg/m2  SpO2 98% Physical Exam  Constitutional: He is oriented to person, place, and time. He appears well-developed and well-nourished.  HENT:  Head: Normocephalic and atraumatic.  Right Ear: External ear normal.  Left Ear: External ear normal.  Nose: Nose normal.  Overall poor dentition  Eyes: EOM  are normal. Pupils are equal, round, and reactive to light. Right eye exhibits no discharge. Left eye exhibits no discharge.  Neck: Normal range of motion. Neck supple.  Normal passive ROM without stiffness or pain  Cardiovascular: Normal rate, regular rhythm, normal heart sounds and intact distal pulses.   Pulmonary/Chest: Effort normal.  Abdominal: Soft. There is no tenderness.  Musculoskeletal: He exhibits no edema.  Neurological: He is alert and oriented to person, place, and time.  CN 2-12 grossly intact. 5/5 strength in all 4 extremities  Skin: Skin is warm  and dry. He is not diaphoretic.  Nursing note and vitals reviewed.   ED Course  Procedures (including critical care time) Labs Review Labs Reviewed  BASIC METABOLIC PANEL - Abnormal; Notable for the following:    Glucose, Bld 108 (*)    All other components within normal limits  CBC    Imaging Review No results found.   EKG Interpretation None      MDM   Final diagnoses:  Headache, unspecified headache type  Essential hypertension    Patient's headache likely related to uncontrolled hypertension as he has had symptoms like this in the past. Blood pressures trended down without any treatment in the ER. Patient is still mildly hypertensive, 140/90. Will start on HCTZ as I'm unable to find the previous medicine patient was on. His neurologic exam is normal as far as his headache, no neck stiffness, and no concerning symptoms to suggest stroke, subarachnoid hemorrhage, intracranial hemorrhage, or mass. Will treat with Tylenol and NSAIDs for pain and referred to a PCP for chronic hypertension management.    Audree Camel, MD 09/24/14 [phone]

## 2014-09-24 NOTE — ED Notes (Signed)
Pt reports ha for 2 days which he thinks is r/t high bp.  Also reports sharp pain in L flank area intermittently.

## 2016-10-31 ENCOUNTER — Emergency Department (HOSPITAL_COMMUNITY)
Admission: EM | Admit: 2016-10-31 | Discharge: 2016-10-31 | Disposition: A | Discharge status: Home / Self Care | Payer: Managed Care, Other (non HMO) | Attending: Emergency Medicine | Admitting: Emergency Medicine

## 2016-10-31 ENCOUNTER — Emergency Department (HOSPITAL_COMMUNITY): Payer: Managed Care, Other (non HMO)

## 2016-10-31 ENCOUNTER — Encounter (HOSPITAL_COMMUNITY): Payer: Self-pay | Admitting: Family Medicine

## 2016-10-31 DIAGNOSIS — I10 Essential (primary) hypertension: Secondary | ICD-10-CM | POA: Diagnosis not present

## 2016-10-31 DIAGNOSIS — F17200 Nicotine dependence, unspecified, uncomplicated: Secondary | ICD-10-CM | POA: Diagnosis not present

## 2016-10-31 DIAGNOSIS — R0600 Dyspnea, unspecified: Secondary | ICD-10-CM | POA: Diagnosis present

## 2016-10-31 DIAGNOSIS — J45909 Unspecified asthma, uncomplicated: Secondary | ICD-10-CM | POA: Diagnosis not present

## 2016-10-31 DIAGNOSIS — J209 Acute bronchitis, unspecified: Secondary | ICD-10-CM | POA: Diagnosis not present

## 2016-10-31 HISTORY — DX: Left bundle-branch block, unspecified: I44.7

## 2016-10-31 HISTORY — DX: Unspecified asthma, uncomplicated: J45.909

## 2016-10-31 MED ORDER — ALBUTEROL SULFATE (2.5 MG/3ML) 0.083% IN NEBU
5.0000 mg | INHALATION_SOLUTION | Freq: Once | RESPIRATORY_TRACT | Status: AC
  Administered 2016-10-31: 5 mg via RESPIRATORY_TRACT
  Filled 2016-10-31: qty 6

## 2016-10-31 MED ORDER — ALBUTEROL SULFATE HFA 108 (90 BASE) MCG/ACT IN AERS
2.0000 | INHALATION_SPRAY | RESPIRATORY_TRACT | Status: DC | PRN
  Administered 2016-10-31: 2 via RESPIRATORY_TRACT
  Filled 2016-10-31: qty 6.7

## 2016-10-31 MED ORDER — ACETAMINOPHEN 325 MG PO TABS
650.0000 mg | ORAL_TABLET | Freq: Once | ORAL | Status: AC
  Administered 2016-10-31: 650 mg via ORAL
  Filled 2016-10-31: qty 2

## 2016-10-31 MED ORDER — DEXAMETHASONE SODIUM PHOSPHATE 10 MG/ML IJ SOLN
10.0000 mg | Freq: Once | INTRAMUSCULAR | Status: AC
  Administered 2016-10-31: 10 mg via INTRAMUSCULAR
  Filled 2016-10-31: qty 1

## 2016-10-31 NOTE — ED Provider Notes (Signed)
[facility]-EMERGENCY DEPT Provider Note: Lowella Dell, MD, FACEP  CSN: [id number] MRN: [id number] ARRIVAL: 10/31/16 at 0215 ROOM: WA03/WA03  By signing my name below, I, Elder Negus, attest that this documentation has been prepared under the direction and in the presence of Paula Libra, MD. Electronically Signed: Elder Negus, Scribe. 10/31/16. 3:22 AM.  CHIEF COMPLAINT  Shortness of Breath and Cough   HISTORY OF PRESENT ILLNESS  FARRELL PANTALEO is a 46 y.o. male with history of asthma, hypertension who presents to the ED for evaluation of dyspnea. This patient states that 2 days ago he developed dyspnea with wheezing, dry cough, as well as bilateral chest soreness when coughing. Today his symptoms became moderate to severe and he presented to the emergency department. He does have an inhaler at his home in [place] but did not bring it with him as he was asymptomatic; he was called to town urgently to visit a dying brother. He reports a single instance of post-tussive vomiting. He denies exertional exacerbation. He denies a fever.  Past Medical History:  Diagnosis Date  . Asthma   . Hypertension   . Kidney stones   . Left bundle branch block   . Seasonal allergies     History reviewed. No pertinent surgical history.  Family History  Problem Relation Age of Onset  . Hypertension Mother   . Hypertension Father   . Diabetes Neg Hx   . Heart attack Neg Hx   . Hyperlipidemia Neg Hx   . Sudden death Neg Hx     Social History  Substance Use Topics  . Smoking status: Current Every Day Smoker    Packs/day: 0.50  . Smokeless tobacco: Never Used  . Alcohol use Yes     Comment: 2 beers a day. Last drink: yesterday.     Prior to Admission medications   Medication Sig Start Date End Date Taking? Authorizing Provider  hydrochlorothiazide (HYDRODIURIL) 25 MG tablet Take 1 tablet (25 mg total) by mouth daily. 09/24/14  Yes Pricilla Loveless, MD    Allergies Patient has no known  allergies.   REVIEW OF SYSTEMS  Negative except as noted here or in the History of Present Illness.   PHYSICAL EXAMINATION  Initial Vital Signs Blood pressure (!) 131/91, pulse (!) 133, temperature 97.9 F (36.6 C), temperature source Oral, resp. rate (!) 28, height 6\' 3"  (1.905 m), weight 265 lb (120.2 kg), SpO2 95 %.  Examination General: Well-developed, well-nourished male in no acute distress; appearance consistent with age of record HENT: normocephalic; atraumatic Eyes: pupils equal, round and reactive to light; extraocular muscles intact Neck: supple Heart: regular rhythm; tachycardic Lungs: Decreased movement bilaterally with expiratory and inspiratory wheezing Abdomen: soft; nondistended; nontender; no masses or hepatosplenomegaly; bowel sounds present Extremities: No deformity; trace edema in the bilateral lower extremities; full range of motion; pulses normal Neurologic: Awake, alert and oriented; motor function intact in all extremities and symmetric; no facial droop Skin: Warm and dry Psychiatric: Normal mood and affect  RESULTS  Summary of this visit's results, reviewed by myself:   EKG Interpretation  Date/Time:  Friday October 31 2016 02:37:52 EDT Ventricular Rate:  108 PR Interval:    QRS Duration: 148 QT Interval:  389 QTC Calculation: 522 R Axis:   93 Text Interpretation:  Sinus tachycardia Left bundle branch block No previous ECGs available but patient known to have LBBB Reconfirmed by Read Drivers  MD, Jonny Ruiz ([id number]) on 10/31/2016 2:55:38 AM      Laboratory Studies: No  results found for this or any previous visit (from the past 24 hour(s)). Imaging Studies: Dg Chest 2 View  Result Date: 10/31/2016 CLINICAL DATA:  46 year old male with shortness of breath and chest tightness. EXAM: CHEST  2 VIEW COMPARISON:  Chest radiograph dated 09/16/2009 FINDINGS: The heart size and mediastinal contours are within normal limits. Both lungs are clear. The visualized skeletal  structures are unremarkable. IMPRESSION: No active cardiopulmonary disease. Electronically Signed   By: Elgie CollardArash  Radparvar M.D.   On: 10/31/2016 02:47    ED COURSE  Nursing notes and initial vitals signs, including pulse oximetry, reviewed.  Vitals:   10/31/16 0227 10/31/16 0228 10/31/16 0507  BP: (!) 131/91  129/85  Pulse: (!) 133  (!) 112  Resp: (!) 28  20  Temp: 97.9 F (36.6 C)  98.4 F (36.9 C)  TempSrc: Oral  Oral  SpO2: 95%  95%  Weight:  265 lb (120.2 kg)   Height:  6\' 3"  (1.905 m)    4:44 AM Air movement improved, patient feels better after albuterol neb treatment. Expiratory wheezing remains, will repeat neb treatment.  6:26 AM Lungs clear after repeat neb treatment. Will refill the patient's inhaler as he is currently out. Patient's tachycardia has improved.  PROCEDURES    ED DIAGNOSES     ICD-9-CM ICD-10-CM   1. Acute bronchitis with bronchospasm 466.0 J20.9     I personally performed the services described in this documentation, which was scribed in my presence. The recorded information has been reviewed and is accurate.    Paula LibraJohn Umar Patmon, MD 10/31/16 [phone]

## 2016-10-31 NOTE — ED Triage Notes (Signed)
Patient reports he is experiencing shortness of breath with a clear, productive cough. Pt has [facility] but his symptoms are becoming worse.

## 2019-04-13 ENCOUNTER — Emergency Department (HOSPITAL_BASED_OUTPATIENT_CLINIC_OR_DEPARTMENT_OTHER)
Admission: EM | Admit: 2019-04-13 | Discharge: 2019-04-13 | Disposition: A | Discharge status: Home / Self Care | Payer: Managed Care, Other (non HMO) | Attending: Emergency Medicine | Admitting: Emergency Medicine

## 2019-04-13 ENCOUNTER — Other Ambulatory Visit: Payer: Self-pay

## 2019-04-13 ENCOUNTER — Encounter (HOSPITAL_BASED_OUTPATIENT_CLINIC_OR_DEPARTMENT_OTHER): Payer: Self-pay | Admitting: Emergency Medicine

## 2019-04-13 DIAGNOSIS — I10 Essential (primary) hypertension: Secondary | ICD-10-CM | POA: Insufficient documentation

## 2019-04-13 DIAGNOSIS — J45909 Unspecified asthma, uncomplicated: Secondary | ICD-10-CM | POA: Insufficient documentation

## 2019-04-13 DIAGNOSIS — R519 Headache, unspecified: Secondary | ICD-10-CM

## 2019-04-13 DIAGNOSIS — Z79899 Other long term (current) drug therapy: Secondary | ICD-10-CM | POA: Insufficient documentation

## 2019-04-13 DIAGNOSIS — R51 Headache: Secondary | ICD-10-CM | POA: Insufficient documentation

## 2019-04-13 DIAGNOSIS — F17210 Nicotine dependence, cigarettes, uncomplicated: Secondary | ICD-10-CM | POA: Insufficient documentation

## 2019-04-13 MED ORDER — PROCHLORPERAZINE EDISYLATE 10 MG/2ML IJ SOLN
10.0000 mg | Freq: Once | INTRAMUSCULAR | Status: AC
Start: 2019-04-13 — End: 2019-04-13
  Administered 2019-04-13: 21:00:00 10 mg via INTRAMUSCULAR
  Filled 2019-04-13: qty 2

## 2019-04-13 MED ORDER — LISINOPRIL 10 MG PO TABS: 10.0000 mg | ORAL_TABLET | Freq: Every day | ORAL | 0 refills | Status: AC

## 2019-04-13 MED ORDER — DIPHENHYDRAMINE HCL 50 MG/ML IJ SOLN
25.0000 mg | Freq: Once | INTRAMUSCULAR | Status: AC
  Administered 2019-04-13: 21:00:00 25 mg via INTRAMUSCULAR
  Filled 2019-04-13: qty 1

## 2019-04-13 MED ORDER — DEXAMETHASONE 4 MG PO TABS
10.0000 mg | ORAL_TABLET | Freq: Once | ORAL | Status: AC
  Administered 2019-04-13: 21:00:00 10 mg via ORAL
  Filled 2019-04-13: qty 1

## 2019-04-13 NOTE — ED Triage Notes (Signed)
Patient co left side headache onset yesterday; co pressure over left eye; states intermittent blurred vision in left eye and intermittent dizziness.

## 2019-04-13 NOTE — Discharge Instructions (Signed)
Return for worsening headache, fever, one sided weakness, numbness.

## 2019-04-13 NOTE — ED Provider Notes (Signed)
[facility] EMERGENCY DEPARTMENT Provider Note   CSN: [id number] Arrival date & time: 04/13/19  1935     History   Chief Complaint Chief Complaint  Patient presents with  . Headache    HPI Kyle Wall is a 48 y.o. male.     48 yo M with a chief complaint of a left-sided headache.  This been going on for the past week.  Patient has a history of left-sided headaches and thinks this feels similar but worse.  He denies nausea or vomiting.  It is worse with reading or eye movement.  Denies trauma to the head.  Denies unilateral numbness or weakness denies difficulty with speech or swallowing.  Denies neck pain.  The history is provided by the patient and a relative.  Headache Pain location:  L parietal Quality:  Sharp and dull Radiates to:  Does not radiate Severity currently:  9/10 Severity at highest:  7/10 Onset quality:  Gradual Duration:  1 week Timing:  Constant Progression:  Worsening Chronicity:  Recurrent Similar to prior headaches: yes   Context: bright light   Context comment:  Reading, eye movement Relieved by:  Nothing Worsened by:  Nothing Ineffective treatments:  None tried Associated symptoms: no abdominal pain, no congestion, no diarrhea, no fever, no myalgias and no vomiting     Past Medical History:  Diagnosis Date  . Asthma   . Hypertension   . Kidney stones   . Left bundle branch block   . Seasonal allergies     Patient Active Problem List   Diagnosis Date Noted  . Left wrist pain 10/23/2011    History reviewed. No pertinent surgical history.      Home Medications    Prior to Admission medications   Medication Sig Start Date End Date Taking? Authorizing Provider  LISINOPRIL-HYDROCHLOROTHIAZIDE PO Take by mouth.   Yes [provider]  hydrochlorothiazide (HYDRODIURIL) 25 MG tablet Take 1 tablet (25 mg total) by mouth daily. 09/24/14   Pricilla Loveless, MD  lisinopril (ZESTRIL) 10 MG tablet Take 1 tablet (10 mg  total) by mouth daily. 04/13/19   Melene Plan, DO    Family History Family History  Problem Relation Age of Onset  . Hypertension Mother   . Hypertension Father   . Diabetes Neg Hx   . Heart attack Neg Hx   . Hyperlipidemia Neg Hx   . Sudden death Neg Hx     Social History Social History   Tobacco Use  . Smoking status: Current Every Day Smoker    Packs/day: 0.50  . Smokeless tobacco: Never Used  Substance Use Topics  . Alcohol use: Yes    Comment: 2 beers a day. Last drink: yesterday.   . Drug use: No     Allergies   Patient has no known allergies.   Review of Systems Review of Systems  Constitutional: Negative for chills and fever.  HENT: Negative for congestion and facial swelling.   Eyes: Negative for discharge and visual disturbance.  Respiratory: Negative for shortness of breath.   Cardiovascular: Negative for chest pain and palpitations.  Gastrointestinal: Negative for abdominal pain, diarrhea and vomiting.  Musculoskeletal: Negative for arthralgias and myalgias.  Skin: Negative for color change and rash.  Neurological: Positive for headaches. Negative for tremors and syncope.  Psychiatric/Behavioral: Negative for confusion and dysphoric mood.     Physical Exam Updated Vital Signs BP (!) 152/92 (BP Location: Right Arm)   Pulse 77   Temp 98.3 F (  36.8 C) (Oral)   Resp 18   Ht 6\' 3"  (1.905 m)   Wt 120 kg   SpO2 100%   BMI 33.07 kg/m   Physical Exam Vitals signs and nursing note reviewed.  Constitutional:      Appearance: He is well-developed.  HENT:     Head: Normocephalic and atraumatic.  Eyes:     Pupils: Pupils are equal, round, and reactive to light.  Neck:     Musculoskeletal: Normal range of motion and neck supple.     Vascular: No JVD.  Cardiovascular:     Rate and Rhythm: Normal rate and regular rhythm.     Heart sounds: No murmur. No friction rub. No gallop.   Pulmonary:     Effort: No respiratory distress.     Breath sounds: No  wheezing.  Abdominal:     General: There is no distension.     Tenderness: There is no guarding or rebound.  Musculoskeletal: Normal range of motion.  Skin:    Coloration: Skin is not pale.     Findings: No rash.  Neurological:     Mental Status: He is alert and oriented to person, place, and time.     Sensory: Sensation is intact.     Motor: Motor function is intact.     Coordination: Coordination is intact.     Gait: Gait is intact. Abnormal gait:  painful gait.     Comments: Subjective decrease sensation to the left side of the face.  Otherwise benign neurologic exam.  Psychiatric:        Behavior: Behavior normal.      ED Treatments / Results  Labs (all labs ordered are listed, but only abnormal results are displayed) Labs Reviewed - No data to display  EKG EKG Interpretation  Date/Time:  Wednesday April 13 2019 20:00:04 EDT Ventricular Rate:  74 PR Interval:  180 QRS Duration: 152 QT Interval:  440 QTC Calculation: 488 R Axis:   144 Text Interpretation:  Sinus rhythm with occasional Premature ventricular complexes Right axis deviation Non-specific intra-ventricular conduction block Abnormal ECG No significant change since last tracing Confirmed by Deno Etienne [id number]) on 04/13/2019 8:51:57 PM   Radiology No results found.  Procedures Procedures (including critical care time)  Medications Ordered in ED Medications  prochlorperazine (COMPAZINE) injection 10 mg (10 mg Intramuscular Given 04/13/19 2058)  diphenhydrAMINE (BENADRYL) injection 25 mg (25 mg Intramuscular Given 04/13/19 2058)  dexamethasone (DECADRON) tablet 10 mg (10 mg Oral Given 04/13/19 2057)     Initial Impression / Assessment and Plan / ED Course  I have reviewed the triage vital signs and the nursing notes.  Pertinent labs & imaging results that were available during my care of the patient were reviewed by me and considered in my medical decision making (see chart for details).        48 yo  M with a chief complaint of a left-sided headache.  Going on for a week.  Similar to prior though worse in intensity.  He has a benign neurologic exam.  Will give a headache cocktail and reassess.  Patient reassessed without much change in his pain.  He would like to be discharged at this time.  We will follow-up with neurology.  10:34 PM:  I have discussed the diagnosis/risks/treatment options with the patient and family and believe the pt to be eligible for discharge home to follow-up with PCP, neuro. We also discussed returning to the ED immediately if new or worsening sx  occur. We discussed the sx which are most concerning (e.g., sudden worsening pain, fever, inability to tolerate by mouth) that necessitate immediate return. Medications administered to the patient during their visit and any new prescriptions provided to the patient are listed below.  Medications given during this visit Medications  prochlorperazine (COMPAZINE) injection 10 mg (10 mg Intramuscular Given 04/13/19 2058)  diphenhydrAMINE (BENADRYL) injection 25 mg (25 mg Intramuscular Given 04/13/19 2058)  dexamethasone (DECADRON) tablet 10 mg (10 mg Oral Given 04/13/19 2057)     The patient appears reasonably screen and/or stabilized for discharge and I doubt any other medical condition or other [facility] requiring further screening, evaluation, or treatment in the ED at this time prior to discharge.    Final Clinical Impressions(s) / ED Diagnoses   Final diagnoses:  Left-sided headache    ED Discharge Orders         Ordered    Ambulatory referral to Neurology    Comments: Headache syndrome   04/13/19 2233    lisinopril (ZESTRIL) 10 MG tablet  Daily     04/13/19 2233           Melene PlanFloyd, Connie Lasater, DO 04/13/19 2234

## 2019-04-13 NOTE — ED Notes (Signed)
ED Provider at bedside. 

## 2019-04-13 NOTE — ED Triage Notes (Signed)
Patient states has not taken BP meds in over a year.
